# Patient Record
Sex: Female | Born: 2000 | Race: White | Hispanic: No | Marital: Single | Smoking: Never smoker
Health system: Southern US, Community
[De-identification: ages and names within clinical notes are randomized; demographics above are authoritative.]

---
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
## 2021-02-06 ENCOUNTER — Encounter (HOSPITAL_COMMUNITY): Payer: Self-pay | Admitting: Emergency Medicine

## 2021-02-06 ENCOUNTER — Other Ambulatory Visit: Payer: Self-pay

## 2021-02-06 ENCOUNTER — Emergency Department (HOSPITAL_COMMUNITY)
Admission: EM | Admit: 2021-02-06 | Discharge: 2021-02-07 | Disposition: A | Discharge status: Home / Self Care | Payer: BLUE CROSS/BLUE SHIELD | Attending: Emergency Medicine | Admitting: Emergency Medicine

## 2021-02-06 ENCOUNTER — Emergency Department (HOSPITAL_COMMUNITY): Payer: BLUE CROSS/BLUE SHIELD

## 2021-02-06 DIAGNOSIS — R0602 Shortness of breath: Secondary | ICD-10-CM | POA: Diagnosis not present

## 2021-02-06 DIAGNOSIS — R42 Dizziness and giddiness: Secondary | ICD-10-CM | POA: Insufficient documentation

## 2021-02-06 DIAGNOSIS — R519 Headache, unspecified: Secondary | ICD-10-CM | POA: Insufficient documentation

## 2021-02-06 DIAGNOSIS — Z5321 Procedure and treatment not carried out due to patient leaving prior to being seen by health care provider: Secondary | ICD-10-CM | POA: Insufficient documentation

## 2021-02-06 DIAGNOSIS — R079 Chest pain, unspecified: Secondary | ICD-10-CM | POA: Insufficient documentation

## 2021-02-06 LAB — I-STAT BETA HCG BLOOD, ED (MC, WL, AP ONLY): I-stat hCG, quantitative: <5 m[IU]/mL (ref <5)

## 2021-02-06 LAB — CBC
HCT: 36.2 % (ref 36.0–46.0)
Hemoglobin: 11.7 g/dL — ABNORMAL LOW (ref 12.0–15.0)
MCH: 27.7 pg (ref 26.0–34.0)
MCHC: 32.3 g/dL (ref 30.0–36.0)
MCV: 85.8 fL (ref 80.0–100.0)
Platelets: 208 10*3/uL (ref 150–400)
RBC: 4.22 MIL/uL (ref 3.87–5.11)
RDW: 12.7 % (ref 11.5–15.5)
WBC: 9.7 10*3/uL (ref 4.0–10.5)
nRBC: 0 % (ref 0.0–0.2)

## 2021-02-06 NOTE — ED Triage Notes (Signed)
Patient reports central chest pain with headache and dizziness this evening , mild SOB , no emesis or diaphoresis . Denies cough or fever .

## 2021-02-07 LAB — BASIC METABOLIC PANEL
Anion gap: 9 (ref 5–15)
BUN: 11 mg/dL (ref 6–20)
CO2: 23 mmol/L (ref 22–32)
Calcium: 9.3 mg/dL (ref 8.9–10.3)
Chloride: 106 mmol/L (ref 98–111)
Creatinine, Ser: 0.92 mg/dL (ref 0.44–1.00)
GFR, Estimated: >60 mL/min (ref >60)
Glucose, Bld: 102 mg/dL — ABNORMAL HIGH (ref 70–99)
Potassium: 3.8 mmol/L (ref 3.5–5.1)
Sodium: 138 mmol/L (ref 135–145)

## 2021-02-07 LAB — TROPONIN I (HIGH SENSITIVITY)
Troponin I (High Sensitivity): 3 ng/L (ref <18)
Troponin I (High Sensitivity): 3 ng/L (ref <18)

## 2021-02-07 MED ORDER — ACETAMINOPHEN 325 MG PO TABS
650.0000 mg | ORAL_TABLET | Freq: Once | ORAL | Status: AC
  Administered 2021-02-07: 650 mg via ORAL
  Filled 2021-02-07: qty 2

## 2021-02-07 NOTE — ED Notes (Signed)
Pts mother came in and stated, "im taking her." Pt has walked out of ED doors with mother.

## 2021-02-25 ENCOUNTER — Telehealth: Payer: Self-pay

## 2021-02-25 NOTE — Telephone Encounter (Signed)
NOTES ON FILE FROM  PEDIATRICS [phone], SENT REFERRAL TO SCHEDULING 

## 2021-02-27 ENCOUNTER — Encounter: Payer: Self-pay | Admitting: Neurology

## 2021-02-27 ENCOUNTER — Ambulatory Visit: Payer: BLUE CROSS/BLUE SHIELD | Admitting: Neurology

## 2021-02-27 VITALS — BP 131/71 | HR 69 | Ht 64.0 in | Wt 118.3 lb

## 2021-02-27 DIAGNOSIS — R519 Headache, unspecified: Secondary | ICD-10-CM

## 2021-02-27 DIAGNOSIS — G44209 Tension-type headache, unspecified, not intractable: Secondary | ICD-10-CM

## 2021-02-27 DIAGNOSIS — R6889 Other general symptoms and signs: Secondary | ICD-10-CM

## 2021-02-27 DIAGNOSIS — R42 Dizziness and giddiness: Secondary | ICD-10-CM

## 2021-02-27 DIAGNOSIS — Z87898 Personal history of other specified conditions: Secondary | ICD-10-CM

## 2021-02-27 NOTE — Patient Instructions (Signed)
It was nice to meet you both today.  Your neurological exam is normal thankfully. Nevertheless, I would like to suggest a couple of things. Please continue to stay well-hydrated and well rested. Use Tylenol as needed, can take Advil as needed as well. Avoid daily over-the-counter pain medication. Avoid caffeine. I would recommend that you monitor your blood pressure and pulse as advised by the cardiologist.  We will do a brain MRI with and without contrast to rule out a structural cause of your headaches and dizziness.  We will do an EEG (brainwave test), which we will schedule. We will call you with the results.  I do think seeing the ENT is a good idea especially since you have had sinus congestion and ear infections.  Follow-up with your cardiologist as planned.  Please make an appointment with an eye doctor, any optometrist or ophthalmologist of your choosing is fine. I would recommend a updated formal eye examination, you may need an updated prescription for your eyeglasses.

## 2021-02-27 NOTE — Progress Notes (Signed)
Subjective:    Patient ID: Cynthia Rogers is a 20 y.o. female.  HPI     Huston Foley, MD, PhD [facility] [address] [address] [place], [place] [address]  Dear Dr. Rana Snare,   I saw your patient, Cynthia Rogers, upon your kind request in my neurologic clinic today for initial consultation of her headaches.  The patient is accompanied by her mother today.  As you know, Cynthia Rogers is an 20 year old right-handed woman with a benign medical history other than history of sinus problem, who reports recurrent headaches for the past nearly 1 month. The headache is dull and achy, 3 out of 10 on a severity scale, mostly in the bitemporal and bifrontal areas, she has had intermittent dizziness which seems unrelated to the headache, the headache is not unilateral or throbbing and she does not have migraines frequently but has had a migrainous headache which they called hormonal headaches before. She denies any associated neurological accompaniments such as numbness or tingling or weakness or slurring of speech or droopy face. She has had no blurry vision, she has prescription eyeglasses but has not had an eye exam in years and has been wearing the same eyeglasses for distance and for driving. Her headache is not associated with nausea and vomiting but when she has a dizzy spell she has brief nausea. Her dizzy spells are described as brief, fleeting spinning or swaying sensation especially when getting out of bed in the mornings or standing up from the seated position. Mom reports that after her first syncopal spell she had a brief moment of decreased awareness or attentiveness, no tongue bite, no bladder or bowel incontinence, no stereotypic movements, no convulsions. No family history of seizures, no family history of migraines, but patient has a family history of atrial fibrillation affecting paternal grandmother, paternal great-grandmother, and paternal grandfather had open heart  surgery and stent placements. Maternal grandmother has a history of syncope and mom has a history of syncope. Patient reports having no recollection of the events leading up to her first passing out spell and also no recollection of falling down the stairs recently.  She had 2 recent syncopal episodes for which she had work-up in the emergency room twice and has recently also seen cardiology on 02/25/2021 through [facility].  I reviewed your office note from 02/20/2021.  She was complaining of dizziness and nausea since her syncopal episode on 02/06/2021.  She was referred to ENT.  She was advised to use Tylenol or ibuprofen as needed but to avoid using them around the clock and stay well-hydrated.  She was advised to try to get 8 to 10 hours of sleep each night.  I reviewed the cardiology office note from 02/25/2021.  Patient was advised to get a cardiac monitor, advised to avoid driving for now and keep a log of her blood pressure and heart rate, blood tests were ordered.  I reviewed the emergency room record from 02/07/2021, patient presented to [facility] after a syncopal episode at home.  She had been in cheerleading and was not feeling well she developed a headache after she had a brief syncopal episode at home.  She did not have any head injury.  She also had chest pressure.  She went to [facility] emergency room on 02/06/2021 but left AMA.  In the emergency room on 02/07/2021 she had a head CT without contrast and I reviewed the results: CONCLUSION: No acute intracranial abnormality. Specifically,  no acute intracranial hemorrhage.  She also had an EKG, chest x-ray with benign findings, EKG showed sinus bradycardia with sinus arrhythmia.  [facility]21 due to syncopal episode she fell down stairs at her home.  There was no tongue bite or urinary incontinence.  Patient had chest pain after the syncope but not before.  She  endorsed some anxiety.  Evaluation in the emergency room showed a nonfocal neurological exam, she received multiple x-rays including chest x-ray, right forearm and elbow x-ray, a bedside echocardiogram showed normal EF and EKG showed sinus bradycardia.  MR venogram with and without contrast of the brain on 02/21/2021 showed: CONCLUSION:    No evidence of dural sinus thrombosis.    She had a CT angiogram of the chest with contrast on 02/21/2021 I reviewed the results: CONCLUSION:   No evidence of acute pulmonary thromboembolism.  Transthoracic echocardiogram from 02/21/2021 showed: SUMMARY  The left ventricular size is normal with normal left ventricular wall  thickness.  Left ventricular systolic function is normal; LVEF = 60-65%. Left  ventricular filling pattern is normal.  The right ventricle is normal in size and function.  There is no significant valvular stenosis or regurgitation.  There is no comparison study available.    Her Past Medical History Is Significant For: History reviewed. No pertinent past medical history.  Her Past Surgical History Is Significant For: History reviewed. No pertinent surgical history.  Her Family History Is Significant For: History reviewed. No pertinent family history.  Her Social History Is Significant For: Social History   Socioeconomic History  . Marital status: Single    Spouse name: Not on file  . Number of children: Not on file  . Years of education: Not on file  . Highest education level: Not on file  Occupational History  . Not on file  Tobacco Use  . Smoking status: Never Smoker  . Smokeless tobacco: Never Used  Substance and Sexual Activity  . Alcohol use: Never  . Drug use: Never  . Sexual activity: Not on file  Other Topics Concern  . Not on file  Social History Narrative   Right handed    Caffeine- 1 cup per day    Social Determinants of Health   Financial Resource Strain: Not on file  Food Insecurity: Not on file   Transportation Needs: Not on file  Physical Activity: Not on file  Stress: Not on file  Social Connections: Not on file    Her Allergies Are:  No Known Allergies:   Her Current Medications Are:  Outpatient Encounter Medications as of 02/27/2021  Medication Sig  . Ascorbic Acid (VITAMIN C PO) Take by mouth.  Marland Kitchen. NIKKI 3-0.02 MG tablet Take 1 tablet by mouth daily.   No facility-administered encounter medications on file as of 02/27/2021.  :  Review of Systems:  Out of a complete 14 point review of systems, all are reviewed and negative with the exception of these symptoms as listed below: Review of Systems  Neurological:       Pt reports, she is here to discuss migraines/ h/a. Pt reports 3 week ago at a basketball game she had a nose bleed. Left the game and felt dizzy and also passed out twice went to the [facility] was dx with migraine. Since this event she reports a dull h/a and ringing in here ear. 1 week ago same type episode passed out, dizzy. She has had cardiac work up  and is on 14 day monitor will be completed on 03/07/21.     Objective:  Neurological Exam  Physical Exam Physical Examination:   Vitals:   02/27/21 0757  BP: 131/71  Pulse: 69   Orthostatic vital signs: Blood pressure 135/80 with a pulse of 61, standingstanding 141/ 86 with a pulse of 66. She denies any orthostatic lightheadedness or vertiginous symptoms currently.  General Examination: The patient is a very pleasant 20 y.o. female in no acute distress. She appears well-developed and well-nourished and well groomed.   HEENT: Normocephalic, atraumatic, pupils are equal, round and reactive to light and accommodation. Funduscopic exam is normal with sharp disc margins noted. Extraocular tracking is good without limitation to gaze excursion or nystagmus noted. Normal smooth pursuit is noted. Hearing is grossly intact. Tympanic membranes are clear bilaterally. Face is symmetric with normal facial animation and normal  facial sensation. Speech is clear with no dysarthria noted. There is no hypophonia. There is no lip, neck/head, jaw or voice tremor. Neck is supple with full range of passive and active motion. There are no carotid bruits on auscultation. Oropharynx exam reveals: mild mouth dryness, good dental hygiene. Tongue protrudes centrally and palate elevates symmetrically.   Chest: Clear to auscultation without wheezing, rhonchi or crackles noted.  Heart: S1+S2+0, regular and normal without murmurs, rubs or gallops noted.   Abdomen: Soft, non-tender and non-distended with normal bowel sounds appreciated on auscultation.  Extremities: There is no pitting edema in the distal lower extremities bilaterally.  Skin: Warm and dry without trophic changes noted.  Musculoskeletal: exam reveals no obvious joint deformities, tenderness or joint swelling or erythema.   Neurologically:  Mental status: The patient is awake, alert and oriented in all 4 spheres. Her immediate and remote memory, attention, language skills and fund of knowledge are appropriate. There is no evidence of aphasia, agnosia, apraxia or anomia. Speech is clear with normal prosody and enunciation. Thought process is linear. Mood is normal and affect is normal.  Cranial nerves II - XII are as described above under HEENT exam. In addition: shoulder shrug is normal with equal shoulder height noted. Motor exam: Normal bulk, strength and tone is noted. There is no drift, tremor or rebound. Romberg is negative. Reflexes are 2+ throughout. Babinski: Toes are flexor bilaterally. Fine motor skills and coordination: intact with normal finger taps, normal hand movements, normal rapid alternating patting, normal foot taps and normal foot agility.  Cerebellar testing: No dysmetria or intention tremor on finger to nose testing. Heel to shin is unremarkable bilaterally. There is no truncal or gait ataxia.  Sensory exam: intact to light touch, vibration,  temperature sense in the upper and lower extremities.  Gait, station and balance: She stands easily. No veering to one side is noted. No leaning to one side is noted. Posture is age-appropriate and stance is narrow based. Gait shows normal stride length and normal pace. No problems turning are noted. Tandem walk is unremarkable.   Assessment and Plan:   In summary, ROBI MITTER is a very pleasant 20 y.o.-year old female with a benign underlying medical history, who presents for evaluation of her headaches. She reports a nearly constant headache for the past nearly 3 weeks, since her first syncopal spell on 02/06/2021. Her headache description is not classic for an underlying migraine. Tension headache is a possibility, stress related headache. Neurological exam is benign and reassuring. Nevertheless, I would recommend additional work-up since she has had syncopal spells and dizziness and also spells  of decreased attentiveness. We will do a brain MRI with and without contrast to rule out a structural cause of her dizziness and headaches. We will do an EEG to make sure her brainwave activity is normal. She is advised to stay well-hydrated and well rested. As far as her headaches, I would like to avoid any daily preventative at this time especially since she is not debilitated by the headaches. Propranolol would not be a good option because of the possibility of lowering blood pressure and heart rate. Gabapentin could cause dizziness, amitriptyline can cause orthostatic hypotension and cardiac arrhythmias, Topamax can cause weight loss, Depakote is not a good choice for young female. For now, therefore, I would suggest avoiding a daily preventative for headaches and she can continue with as needed use of Tylenol or Advil. She is advised to avoid taking any over-the-counter pain medication on a daily basis. She is encouraged to monitor her blood pressure and pulse as instructed by the cardiologist. She reports that  she has not had a chance to start a log of her blood pressure and pulse. She is currently wearing a heart monitor for another week. She has a follow-up appointment with her cardiologist. She is encouraged to talk to you about the referral to ENT, I do believe this is a good idea especially given her positional dizziness and her history of sinus congestions. She reports that she was treated with antibiotics 4 times within the last year for sinus infections and ear infection.  We will keep her posted as to her test results by phone call and make a follow-up in this office accordingly. She is advised to seek a formal eye examination with an optometrist or ophthalmologist of her choosing. She does have prescription eyeglasses but has not had a formal eye examination in years.  She is asking about going back to cheerleading. She is advised to talk to you when her cardiologist about this. From the neurological standpoint I do not see any reason for restriction at this time, from my end of things, she is advised to go back to cheerleading once she feels better and work-up is completed. I answered all the questions today and the patient and her mother were in agreement. Thank you very much for allowing me to participate in the care of this nice patient. If I can be of any further assistance to you please do not hesitate to call me at [phone].  Sincerely,   Huston Foley, MD, PhD

## 2021-02-28 ENCOUNTER — Telehealth: Payer: Self-pay | Admitting: Neurology

## 2021-02-28 NOTE — Telephone Encounter (Signed)
45 minutes MRI Brain w/wo contrast Dr. Alinda Sierras Berkley Harvey: [id number] exp 02/27/21-03/28/21 scheduled with mom at [facility] 03/13/21

## 2021-03-11 NOTE — Telephone Encounter (Signed)
Patient mom Cynthia Rogers left me a voicemail on my phone wanted to r/s [facility] appt. I called her back was unable to get a hold of her. I left her a voicemail to call me back to r/s. I did inform her in the vmail that I went ahead and cancel the appt and for her to call back to r/s.

## 2021-03-13 ENCOUNTER — Ambulatory Visit: Payer: BLUE CROSS/BLUE SHIELD

## 2021-03-13 ENCOUNTER — Telehealth: Payer: Self-pay

## 2021-03-13 ENCOUNTER — Other Ambulatory Visit: Payer: Self-pay

## 2021-03-13 ENCOUNTER — Other Ambulatory Visit: Payer: BLUE CROSS/BLUE SHIELD

## 2021-03-13 DIAGNOSIS — G44209 Tension-type headache, unspecified, not intractable: Secondary | ICD-10-CM

## 2021-03-13 DIAGNOSIS — R519 Headache, unspecified: Secondary | ICD-10-CM

## 2021-03-13 DIAGNOSIS — Z87898 Personal history of other specified conditions: Secondary | ICD-10-CM

## 2021-03-13 DIAGNOSIS — R42 Dizziness and giddiness: Secondary | ICD-10-CM | POA: Diagnosis not present

## 2021-03-13 DIAGNOSIS — R6889 Other general symptoms and signs: Secondary | ICD-10-CM

## 2021-03-13 MED ORDER — GADOBENATE DIMEGLUMINE 529 MG/ML IV SOLN
10.0000 mL | Freq: Once | INTRAVENOUS | Status: AC | PRN
  Administered 2021-03-13: 10 mL via INTRAVENOUS

## 2021-03-13 MED ORDER — ALPRAZOLAM 0.5 MG PO TABS: ORAL_TABLET | ORAL | 0 refills | Status: DC

## 2021-03-13 NOTE — Telephone Encounter (Signed)
Patient mom called me and wanted to r/s her appt she wanted her same time for today I informed her I do not have the time slot available anymore but I do have one time slot this morning to arrive at 10 AM. She took that appt. But she informed me that her daughter is claustrophobic and is already freaking about having the MRI done and wants something sent to her pharmacy ASAP for her appt for this morning.

## 2021-03-13 NOTE — Telephone Encounter (Signed)
Me Pt's mom called this morning asking for Xanax for pt's MRI scheduled for 1000 am today. I spoke with Dr. Frances Furbish verbally on this and she is agreeable to sending Xanax 0.5 mg 3 tabs 1-2 tabs 30 min before MRI and 1 additional tablet when she goes back for MRI.  I have placed order and faxed to [facility] in [place].  Pt's mom advised of rx and advised pt would need a driver after MRI is completed.

## 2021-03-13 NOTE — Telephone Encounter (Signed)
Noted thank you

## 2021-03-13 NOTE — Telephone Encounter (Signed)
Pt's mom called this morning asking for Xanax for pt's MRI scheduled for 1000 am today. I spoke with Dr. Frances Furbish verbally on this and she is agreeable to sending Xanax 0.5 mg 3 tabs 1-2 tabs 30 min before MRI and 1 additional tablet when she goes back for MRI.  I have placed order and faxed to [facility] in [place].  Pt's mom advised of rx and advised pt would need a driver after MRI is completed.

## 2021-03-14 NOTE — Progress Notes (Signed)
Please call patient and advise her that her brain MRI with and without contrast from 03/14/21 was reported as normal.

## 2021-03-18 ENCOUNTER — Telehealth: Payer: Self-pay | Admitting: Neurology

## 2021-03-18 NOTE — Telephone Encounter (Signed)
Pt's mother, Chelan Heringer (on [place]) called, checking on status of MRI results. Would like a call from the nurse.

## 2021-03-18 NOTE — Telephone Encounter (Signed)
Pt's mom advised of results below. She verbalized understanding.   Please call patient and advise her that her brain MRI with and without contrast from 03/14/21 was reported as normal.

## 2021-03-25 ENCOUNTER — Ambulatory Visit (INDEPENDENT_AMBULATORY_CARE_PROVIDER_SITE_OTHER): Payer: BLUE CROSS/BLUE SHIELD | Admitting: Diagnostic Neuroimaging

## 2021-03-25 DIAGNOSIS — G44209 Tension-type headache, unspecified, not intractable: Secondary | ICD-10-CM

## 2021-03-25 DIAGNOSIS — Z87898 Personal history of other specified conditions: Secondary | ICD-10-CM

## 2021-03-25 DIAGNOSIS — R519 Headache, unspecified: Secondary | ICD-10-CM

## 2021-03-25 DIAGNOSIS — R55 Syncope and collapse: Secondary | ICD-10-CM | POA: Diagnosis not present

## 2021-03-25 DIAGNOSIS — R6889 Other general symptoms and signs: Secondary | ICD-10-CM

## 2021-03-25 DIAGNOSIS — R42 Dizziness and giddiness: Secondary | ICD-10-CM

## 2021-04-05 ENCOUNTER — Telehealth: Payer: Self-pay | Admitting: Neurology

## 2021-04-05 ENCOUNTER — Encounter: Payer: Self-pay | Admitting: Cardiology

## 2021-04-05 ENCOUNTER — Ambulatory Visit: Payer: BLUE CROSS/BLUE SHIELD | Admitting: Cardiology

## 2021-04-05 ENCOUNTER — Other Ambulatory Visit: Payer: Self-pay

## 2021-04-05 VITALS — BP 110/70 | HR 58 | Ht 64.0 in | Wt 118.0 lb

## 2021-04-05 DIAGNOSIS — R55 Syncope and collapse: Secondary | ICD-10-CM | POA: Diagnosis not present

## 2021-04-05 NOTE — Patient Instructions (Signed)
Medication Instructions:  The current medical regimen is effective;  continue present plan and medications.  *If you need a refill on your cardiac medications before your next appointment, please call your pharmacy*  Follow-Up: At [facility], you and your health needs are our priority.  As part of our continuing mission to provide you with exceptional heart care, we have created designated Provider Care Teams.  These Care Teams include your primary Cardiologist (physician) and Advanced Practice Providers (APPs -  Physician Assistants and Nurse Practitioners) who all work together to provide you with the care you need, when you need it.  We recommend signing up for the patient portal called "MyChart".  Sign up information is provided on this After Visit Summary.  MyChart is used to connect with patients for Virtual Visits (Telemedicine).  Patients are able to view lab/test results, encounter notes, upcoming appointments, etc.  Non-urgent messages can be sent to your provider as well.   To learn more about what you can do with MyChart, go to [email or web address].    Your next appointment:   4 month(s)  The format for your next appointment:   In Person  Provider:   Mark Skains, MD   Thank you for choosing [facility]!!     

## 2021-04-05 NOTE — Telephone Encounter (Signed)
Pt's mother, Tyiesha Brackney (on [place]) called, would like to know my son's EEG results. Could a nurse give me a call?

## 2021-04-05 NOTE — Progress Notes (Signed)
Cardiology Office Note:    Date:  04/05/2021   ID:  Cynthia Rogers, DOB 2001/03/22, MRN [id number]  PCP:  Loyola Mast, MD   [facility]  Cardiologist:  No primary care provider on file.  Advanced Practice Provider:  No care team member to display Electrophysiologist:  None       Referring MD: Loyola Mast, MD     History of Present Illness:    Cynthia Rogers is a 20 y.o. female here for the evaluation of syncope at the request of Dr. Loyola Mast.  In review of prior office note from [facility] cardiology office, she had 2 episodes of syncope, very active, intense cheerleading exercises for close to 2 hours without any difficulties.  On February 07, 2021 she did not feel well after competition and experienced a nosebleed.  She felt nauseous, dizzy and her mother was called and she was taken home.  During this feeling of dizziness, she passed out briefly.  After regaining consciousness she felt a headache and some chest discomfort located midsternal moderate in intensity lasting for about 2 hours.  It was not reproducible with inspiration or palpation of chest or positional changes.  She went to the emergency department at [facility] where an EKG was done and showed sinus bradycardia 53 with sinus arrhythmia-normal variant.  Troponin was normal pregnancy test negative white blood cell count 9.7 hemoglobin 11.7 sodium 138 potassium 3.8 creatinine 0.92.  Chest x-ray negative.  Head CT in the ER negative.  She stated that she had continuous headache since her first episode.  An echocardiogram was performed on February 21, 2021 at [facility] that showed normal EF 6065% no LVH no valvular disease no pericardial effusion.  MRI of brain was normal.  CT of chest in the ER showed no PE no aneurysm no pneumothorax.  Blood pressure originally was noted to be 91/59 with heart rate of 44.  No smoking.  No early family history of CAD.  No  early family history of sudden cardiac death.  It was thought that her syncope was vasovagal versus arrhythmias versus bradycardia versus seizures on the differential.  Echo was normal CTA of chest and head and MR venogram were normal.  Blood work unremarkable.  A complete cardiac monitor was ordered, salt liberalization fluids.   No past medical history on file.  No past surgical history on file.  Current Medications: Current Meds  Medication Sig  . Ascorbic Acid (VITAMIN C PO) Take by mouth.  . fludrocortisone (FLORINEF) 0.1mg /mL SUSP Take 0.1 mg by mouth daily.  [name] 3-0.02 MG tablet Take 1 tablet by mouth daily.     Allergies:   Patient has no known allergies.   Social History   Socioeconomic History  . Marital status: Single    Spouse name: Not on file  . Number of children: Not on file  . Years of education: Not on file  . Highest education level: Not on file  Occupational History  . Not on file  Tobacco Use  . Smoking status: Never Smoker  . Smokeless tobacco: Never Used  Substance and Sexual Activity  . Alcohol use: Never  . Drug use: Never  . Sexual activity: Not on file  Other Topics Concern  . Not on file  Social History Narrative   Right handed    Caffeine- 1 cup per day    Social Determinants of Health   Financial Resource Strain: Not  on file  Food Insecurity: Not on file  Transportation Needs: Not on file  Physical Activity: Not on file  Stress: Not on file  Social Connections: Not on file     Family History: The patient's family history includes Hypercholesterolemia in her paternal grandfather and paternal grandmother; Hypertension in her maternal grandmother and paternal grandmother.  ROS:   Please see the history of present illness.     All other systems reviewed and are negative.  EKGs/Labs/Other Studies Reviewed:    The following studies were reviewed today: As above  EKG:  NSR from 02/07/21  Recent Labs: 02/06/2021: BUN 11;  Creatinine, Ser 0.92; Hemoglobin 11.7; Platelets 208; Potassium 3.8; Sodium 138  Recent Lipid Panel No results found for: CHOL, TRIG, HDL, CHOLHDL, VLDL, LDLCALC, LDLDIRECT   Risk Assessment/Calculations:      Physical Exam:    VS:  BP 110/70 (BP Location: Left Arm, Patient Position: Sitting, Cuff Size: Normal)   Pulse (!) 58   Ht 5\' 4"  (1.626 m)   Wt 118 lb (53.5 kg)   SpO2 98%   BMI 20.25 kg/m     Wt Readings from Last 3 Encounters:  04/05/21 118 lb (53.5 kg) (36 %, Z= -0.36)*  02/27/21 118 lb 5 oz (53.7 kg) (37 %, Z= -0.32)*  02/06/21 132 lb 4.4 oz (60 kg) (64 %, Z= 0.37)*   * Growth percentiles are based on CDC (Girls, 2-20 Years) data.     GEN:  Well nourished, well developed in no acute distress HEENT: Normal NECK: No JVD; No carotid bruits LYMPHATICS: No lymphadenopathy CARDIAC: RRR, no murmurs, rubs, gallops RESPIRATORY:  Clear to auscultation without rales, wheezing or rhonchi  ABDOMEN: Soft, non-tender, non-distended MUSCULOSKELETAL:  No edema; No deformity  SKIN: Warm and dry NEUROLOGIC:  Alert and oriented x 3 PSYCHIATRIC:  Normal affect   ASSESSMENT:    1. 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  We discussed at length today and several questions were answered.  Her father was present.  Thankfully, all work-up has been reassuring including echocardiogram, MR brain venogram, chest x-ray, troponin. -She is currently on fludrocortisone 0.1 mg once a day.  I am fine with this.  Continue with salt liberalization, fluids, sports drinks.  Hopefully in the next several months, we will be able to taper off the fludrocortisone. -She also volunteered that anxiety may be a component to this, triggering some of these experiences or part of the constellation of these experiences.  She will continue to seek guidance in these matters. -Based upon her extensive and thorough  work-up, I am comfortable with her proceeding with cheerleading.  She understands that if she begins to feel any prodrome, to lay down with her feet up would be ideal.  She will also continue to hydrate aggressively.  Increased protein intake, 5 pound weight gain for instance would also be very helpful in these situations.  We will see her back in 4 months    Medication Adjustments/Labs and Tests Ordered: Current medicines are reviewed at length with the patient today.  Concerns regarding medicines are outlined above.  No orders of the defined types were placed in this encounter.  No orders of the defined types were placed in this encounter.   Patient Instructions  Medication Instructions:  The current medical regimen is effective;  continue present plan and medications.  *If you need a refill on  your cardiac medications before your next appointment, please call your pharmacy*  Follow-Up: At [facility], you and your health needs are our priority.  As part of our continuing mission to provide you with exceptional heart care, we have created designated Provider Care Teams.  These Care Teams include your primary Cardiologist (physician) and Advanced Practice Providers (APPs -  Physician Assistants and Nurse Practitioners) who all work together to provide you with the care you need, when you need it.  We recommend signing up for the patient portal called "MyChart".  Sign up information is provided on this After Visit Summary.  MyChart is used to connect with patients for Virtual Visits (Telemedicine).  Patients are able to view lab/test results, encounter notes, upcoming appointments, etc.  Non-urgent messages can be sent to your provider as well.   To learn more about what you can do with MyChart, go to [email or web address].    Your next appointment:   4 month(s)  The format for your next appointment:   In Person  Provider:   Donato Schultz, MD  Thank you for choosing [facility]!!         Signed, Donato Schultz, MD  04/05/2021 12:37 PM    [facility]

## 2021-04-08 NOTE — Telephone Encounter (Signed)
Pt's mother, Cynthia Rogers (on [place]) called, has been 2 weeks since EEG. Would like a call from the nurse for  EEG results.

## 2021-04-08 NOTE — Progress Notes (Signed)
See ph note from today.

## 2021-04-08 NOTE — Telephone Encounter (Signed)
  I called patient's mom, and we reviewed results of EEG.  Patient's mom verbalized understanding and requested records to be sent to Dr. Almeta Monas. Fax # [phone].  While patient's mother expressed displeasure with the timeframe of receiving the patient's MRI and EEG reports.  She states that she waited over a week for the MRI report and then 2 weeks for the EEG reading.  Patient's mother was upset with how long these reports took to be resulted and felt like her daughter was not being cared for appropriately.  Verbalized apologies to the patient's mother in regards to her concern, and advised I could send a message to my supervisor letting her know of her complaint.  Patient's mom agreed for message to be sent to supervisor, and if the supervisor would not mind calling her.  I once again expressed apologies about the delay in her daughter's results.  Patient's mom was not unkind, just irritated with the wait times.

## 2021-04-08 NOTE — Telephone Encounter (Signed)
Normal eeg. -VRP

## 2021-04-08 NOTE — Telephone Encounter (Signed)
Please call and advise the patient that the EEG or brain wave test we performed was reported as normal in the awake and drowsy states. We checked for abnormal electrical discharges in the brain waves and the report suggested normal findings. No further action is required on this test at this time.   So long as she had an updated and benign eye examination - as discussed during our clinic visit, she can follow-up with her primary care and cardiologist as scheduled.  Thanks,  Huston Foley, MD, PhD

## 2021-04-08 NOTE — Procedures (Signed)
   [facility]  EEG (ELECTROENCEPHALOGRAM) REPORT   STUDY DATE: 03/25/21 PATIENT NAME: Cynthia Rogers DOB: Nov 23, 2001 MRN: [id number]  ORDERING CLINICIAN: Huston Foley, MD PhD   TECHNOLOGIST: Ardis Hughs  TECHNIQUE: Electroencephalogram was recorded utilizing standard 10-20 system of lead placement and reformatted into average and bipolar montages.  RECORDING TIME: 27 minutes  ACTIVATION: hyperventilation and photic stimulation  CLINICAL INFORMATION: 20 year old female with syncope.  FINDINGS: Posterior dominant background rhythms, which attenuate with eye opening, ranging 12-13 hertz and 15-20 microvolts. No focal, lateralizing, epileptiform activity or seizures are seen. Patient recorded in the awake and drowsy state. EKG channel shows regular rhythm of 50-60 beats per minute.   IMPRESSION:   Normal EEG in the awake and drowsy states.     INTERPRETING PHYSICIAN:  Suanne Marker, MD Certified in Neurology, Neurophysiology and Neuroimaging  [facility] [address], [address] [place], [place] [address] [phone]

## 2021-04-24 ENCOUNTER — Ambulatory Visit: Payer: BLUE CROSS/BLUE SHIELD | Admitting: Endocrinology

## 2021-08-05 ENCOUNTER — Ambulatory Visit: Payer: BLUE CROSS/BLUE SHIELD | Admitting: Cardiology

## 2021-12-10 IMAGING — CR DG CHEST 2V
2 series · 2 of 2 positions shown · non-contrast
Comparison: None.

CLINICAL DATA: Central chest pain with severe headache and
dizziness this evening, mild shortness of breath since basketball
game this evening. Denies fever or cough.

EXAM:
CHEST - 2 VIEW

[chest pa]
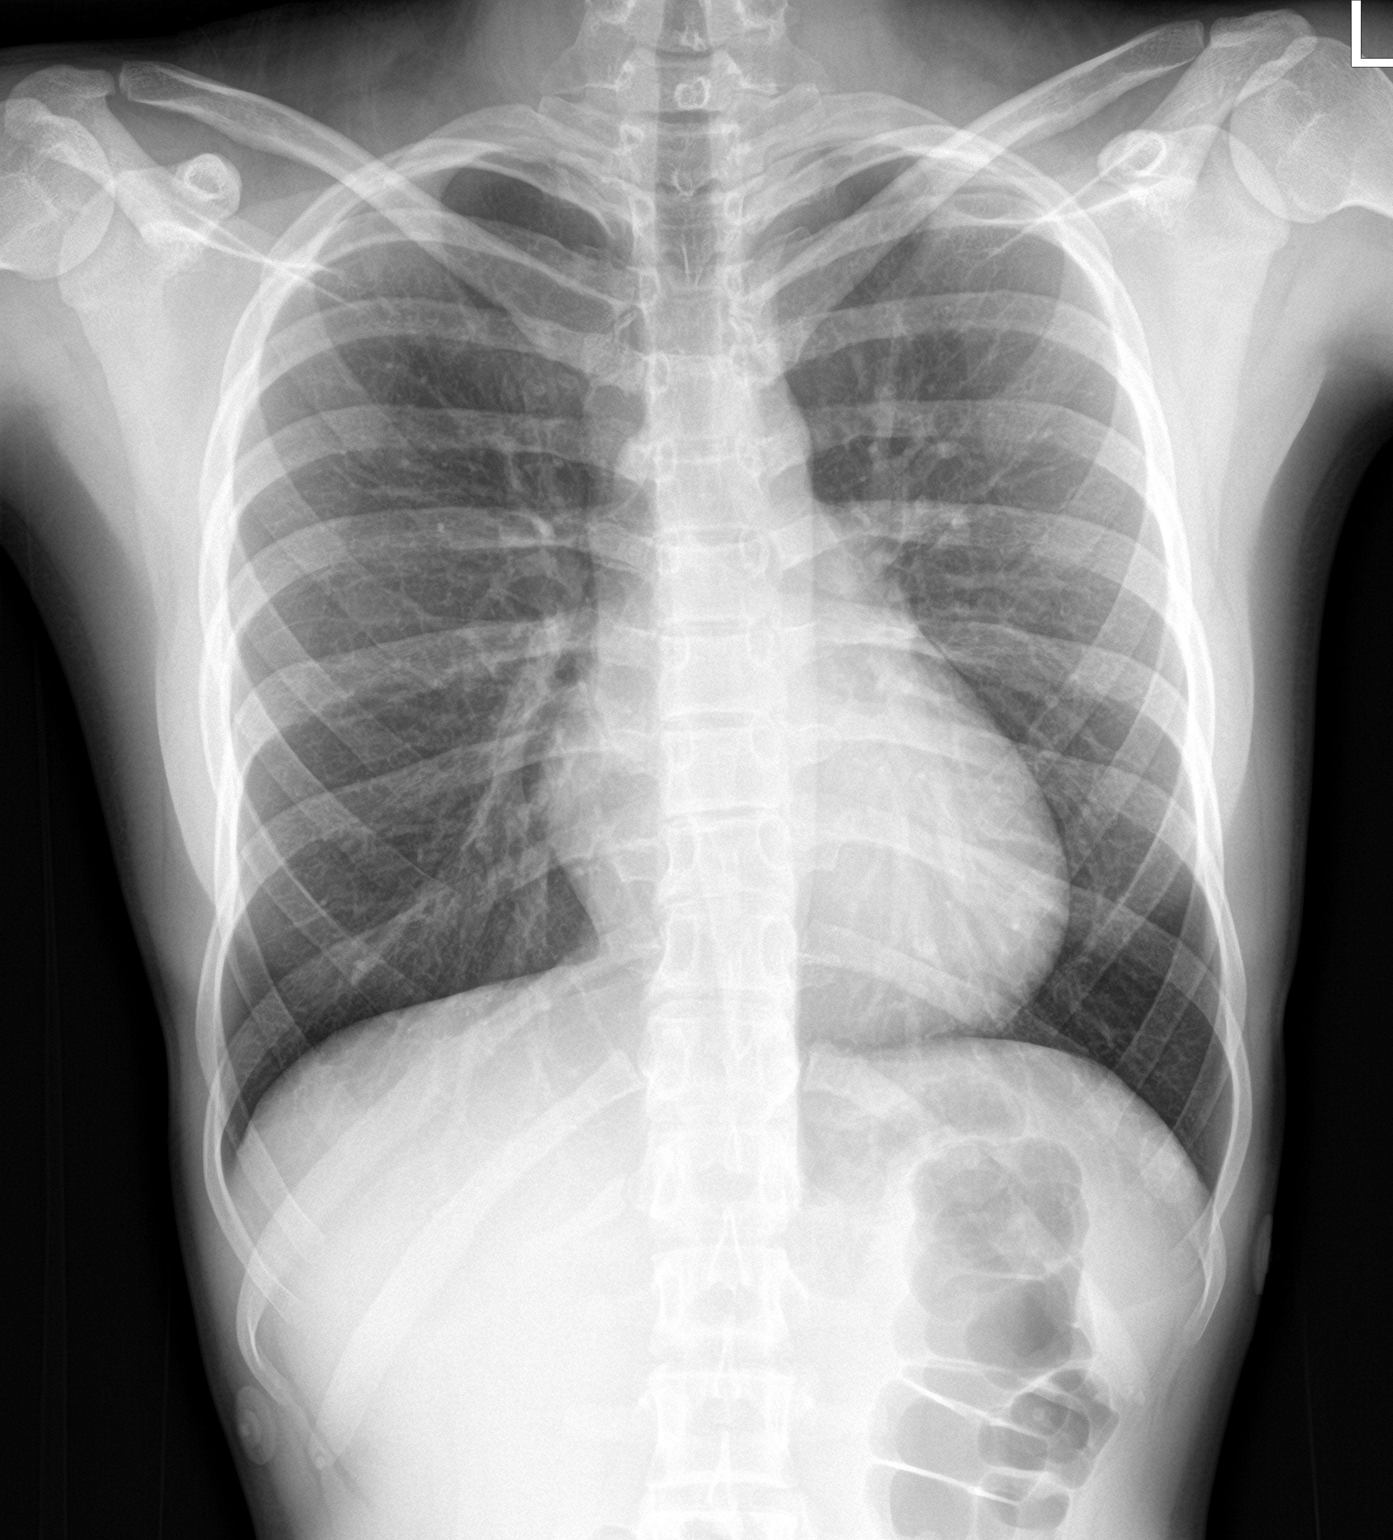

[chest lat]
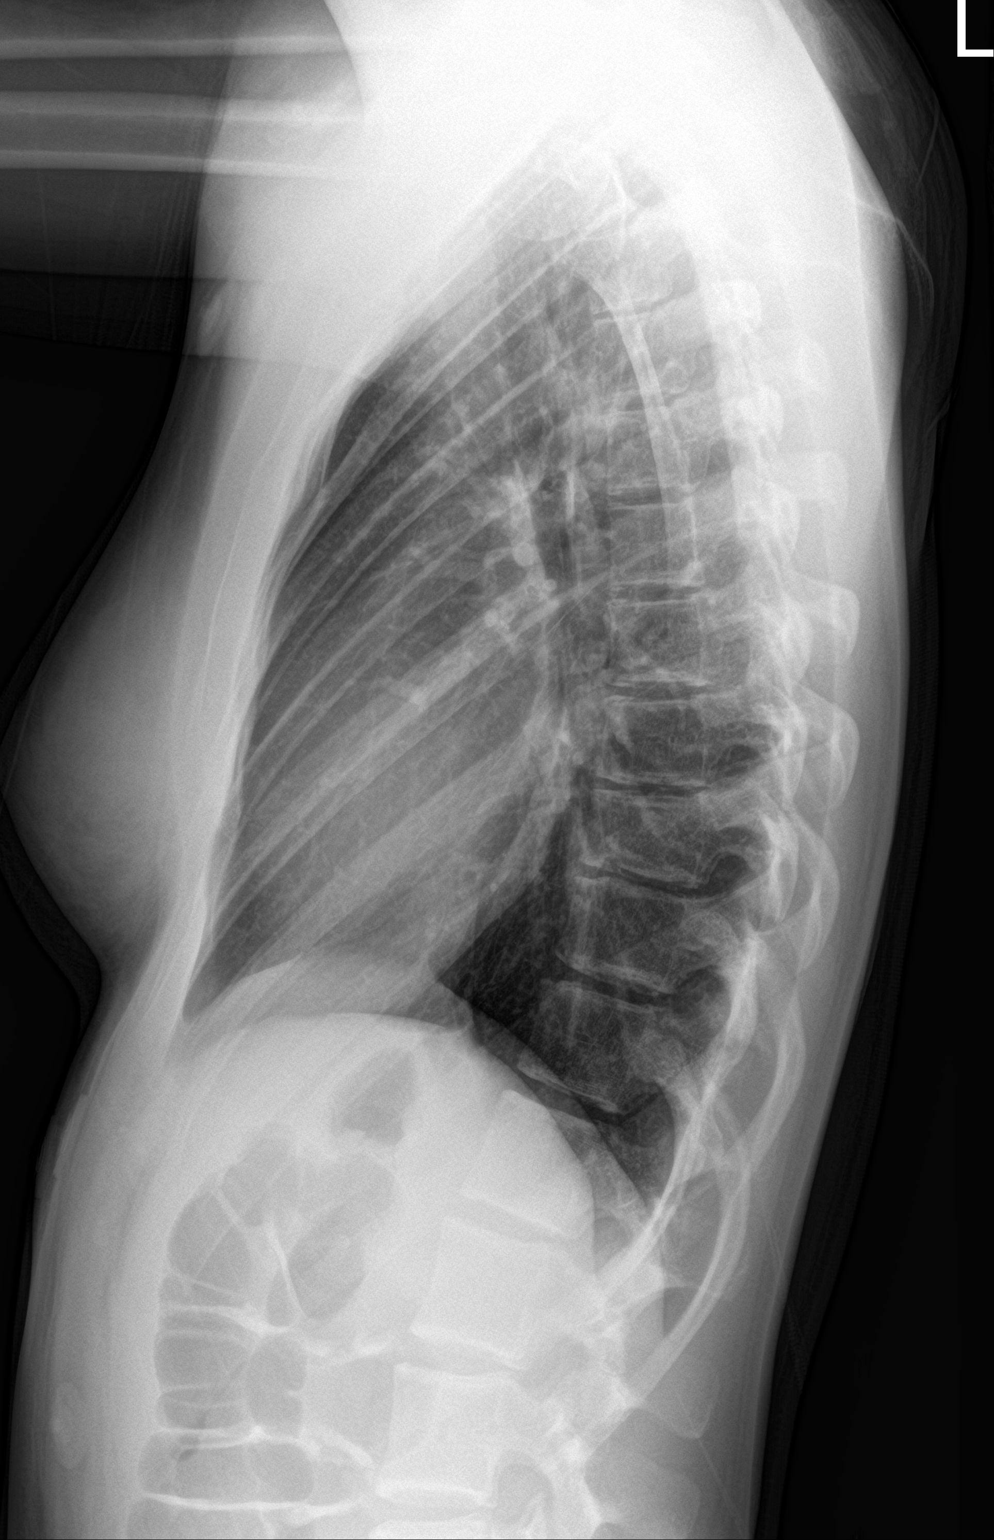

[2 of 2 positions shown; findings below may reference images not displayed]

FINDINGS: The heart size and mediastinal contours are within normal limits.
Both lungs are clear. The visualized skeletal structures are
unremarkable.
IMPRESSION: No active cardiopulmonary disease.
